# Patient Record
Sex: Male | Born: 1990 | Race: White | Hispanic: No | Marital: Single | Smoking: Never smoker
Health system: Southern US, Community
[De-identification: ages and names within clinical notes are randomized; demographics above are authoritative.]

## PROBLEM LIST (undated history)

## (undated) HISTORY — PX: ABDOMINAL SURGERY: SHX537

---
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
## 2011-06-17 ENCOUNTER — Emergency Department: Payer: Self-pay | Admitting: *Deleted

## 2012-09-22 ENCOUNTER — Emergency Department: Payer: Self-pay | Admitting: Emergency Medicine

## 2012-09-23 LAB — URINALYSIS, COMPLETE
Blood: NEGATIVE
Glucose,UR: NEGATIVE mg/dL (ref 0–75)
Leukocyte Esterase: NEGATIVE
Nitrite: NEGATIVE
Ph: 8 (ref 4.5–8.0)
Specific Gravity: 1.02 (ref 1.003–1.030)
Squamous Epithelial: <1

## 2012-09-23 LAB — COMPREHENSIVE METABOLIC PANEL
Alkaline Phosphatase: 96 U/L (ref 50–136)
Anion Gap: 8 (ref 7–16)
Co2: 24 mmol/L (ref 21–32)
Creatinine: 1.21 mg/dL (ref 0.60–1.30)
EGFR (Non-African Amer.): >60
Glucose: 110 mg/dL — ABNORMAL HIGH (ref 65–99)
Potassium: 3.5 mmol/L (ref 3.5–5.1)
Sodium: 136 mmol/L (ref 136–145)

## 2012-09-23 LAB — CBC
HCT: 44.4 % (ref 40.0–52.0)
MCHC: 33.5 g/dL (ref 32.0–36.0)
MCV: 90 fL (ref 80–100)
Platelet: 197 10*3/uL (ref 150–440)
RBC: 4.92 10*6/uL (ref 4.40–5.90)
RDW: 12.7 % (ref 11.5–14.5)
WBC: 18.7 10*3/uL — ABNORMAL HIGH (ref 3.8–10.6)

## 2013-07-06 ENCOUNTER — Emergency Department: Payer: Self-pay | Admitting: Emergency Medicine

## 2013-07-06 LAB — COMPREHENSIVE METABOLIC PANEL
Albumin: 4.1 g/dL (ref 3.4–5.0)
Alkaline Phosphatase: 98 U/L (ref 50–136)
Anion Gap: 6 — ABNORMAL LOW (ref 7–16)
Bilirubin,Total: 0.7 mg/dL (ref 0.2–1.0)
Creatinine: 1.13 mg/dL (ref 0.60–1.30)
EGFR (African American): >60
Osmolality: 274 (ref 275–301)
Potassium: 3.2 mmol/L — ABNORMAL LOW (ref 3.5–5.1)
SGPT (ALT): 17 U/L (ref 12–78)
Total Protein: 7.3 g/dL (ref 6.4–8.2)

## 2013-07-06 LAB — URINALYSIS, COMPLETE
Bacteria: NONE SEEN
Blood: NEGATIVE
Glucose,UR: NEGATIVE mg/dL (ref 0–75)
Ketone: NEGATIVE
Leukocyte Esterase: NEGATIVE
Nitrite: NEGATIVE
Ph: 7 (ref 4.5–8.0)
Protein: NEGATIVE
RBC,UR: <1 /HPF (ref 0–5)
Specific Gravity: 1.014 (ref 1.003–1.030)
Squamous Epithelial: <1

## 2013-07-06 LAB — ETHANOL
Ethanol %: 0.004 % (ref 0.000–0.080)
Ethanol: 4 mg/dL

## 2013-07-06 LAB — DRUG SCREEN, URINE
Benzodiazepine, Ur Scrn: NEGATIVE (ref ?–200)
MDMA (Ecstasy)Ur Screen: NEGATIVE (ref ?–500)
Methadone, Ur Screen: NEGATIVE (ref ?–300)
Phencyclidine (PCP) Ur S: NEGATIVE (ref ?–25)
Tricyclic, Ur Screen: NEGATIVE (ref ?–1000)

## 2013-07-06 LAB — CBC
HCT: 42 % (ref 40.0–52.0)
Platelet: 198 10*3/uL (ref 150–440)
RBC: 4.76 10*6/uL (ref 4.40–5.90)
RDW: 13 % (ref 11.5–14.5)

## 2014-05-23 ENCOUNTER — Emergency Department: Payer: Self-pay | Admitting: Emergency Medicine

## 2014-09-14 ENCOUNTER — Emergency Department: Payer: Self-pay | Admitting: Emergency Medicine

## 2014-09-14 LAB — CBC WITH DIFFERENTIAL/PLATELET
Basophil #: 0.1 10*3/uL (ref 0.0–0.1)
Basophil %: 0.5 %
Eosinophil #: 0.1 10*3/uL (ref 0.0–0.7)
Eosinophil %: 0.7 %
HCT: 43.3 % (ref 40.0–52.0)
HGB: 14.4 g/dL (ref 13.0–18.0)
LYMPHS PCT: 12.5 %
Lymphocyte #: 1.6 10*3/uL (ref 1.0–3.6)
MCH: 30 pg (ref 26.0–34.0)
MCHC: 33.2 g/dL (ref 32.0–36.0)
MCV: 91 fL (ref 80–100)
Monocyte #: 0.7 x10 3/mm (ref 0.2–1.0)
Monocyte %: 5.4 %
NEUTROS ABS: 10.4 10*3/uL — AB (ref 1.4–6.5)
NEUTROS PCT: 80.9 %
Platelet: 165 10*3/uL (ref 150–440)
RBC: 4.78 10*6/uL (ref 4.40–5.90)
RDW: 12.8 % (ref 11.5–14.5)
WBC: 12.9 10*3/uL — ABNORMAL HIGH (ref 3.8–10.6)

## 2014-09-14 LAB — COMPREHENSIVE METABOLIC PANEL
ALT: 20 U/L
Albumin: 4.1 g/dL (ref 3.4–5.0)
Alkaline Phosphatase: 76 U/L
Anion Gap: 6 — ABNORMAL LOW (ref 7–16)
BUN: 8 mg/dL (ref 7–18)
Bilirubin,Total: 0.4 mg/dL (ref 0.2–1.0)
CALCIUM: 8.8 mg/dL (ref 8.5–10.1)
CHLORIDE: 104 mmol/L (ref 98–107)
CO2: 27 mmol/L (ref 21–32)
Creatinine: 1.02 mg/dL (ref 0.60–1.30)
Glucose: 127 mg/dL — ABNORMAL HIGH (ref 65–99)
Osmolality: 274 (ref 275–301)
Potassium: 3.6 mmol/L (ref 3.5–5.1)
SGOT(AST): 14 U/L — ABNORMAL LOW (ref 15–37)
Sodium: 137 mmol/L (ref 136–145)
Total Protein: 7.4 g/dL (ref 6.4–8.2)

## 2014-09-14 LAB — MONONUCLEOSIS SCREEN: Mono Test: POSITIVE

## 2014-09-14 LAB — LIPASE, BLOOD: Lipase: 91 U/L (ref 73–393)

## 2014-12-25 ENCOUNTER — Emergency Department
Admit: 2014-12-25 | Disposition: A | Discharge status: Home / Self Care | Payer: Self-pay | Admitting: Emergency Medicine

## 2014-12-25 LAB — COMPREHENSIVE METABOLIC PANEL
ALBUMIN: 4.7 g/dL
ALK PHOS: 64 U/L
ANION GAP: 4 — AB (ref 7–16)
BILIRUBIN TOTAL: 0.6 mg/dL
BUN: 11 mg/dL
CHLORIDE: 106 mmol/L
CREATININE: 0.93 mg/dL
Calcium, Total: 9.8 mg/dL
Co2: 29 mmol/L
EGFR (African American): >60
EGFR (Non-African Amer.): >60
GLUCOSE: 82 mg/dL
Potassium: 3.9 mmol/L
SGOT(AST): 19 U/L
SGPT (ALT): 15 U/L — ABNORMAL LOW
Sodium: 139 mmol/L
Total Protein: 7.7 g/dL

## 2014-12-25 LAB — CBC WITH DIFFERENTIAL/PLATELET
BASOS ABS: 0 10*3/uL (ref 0.0–0.1)
Basophil %: 0.4 %
Eosinophil #: 0.3 10*3/uL (ref 0.0–0.7)
Eosinophil %: 3.2 %
HCT: 43.9 % (ref 40.0–52.0)
HGB: 14.6 g/dL (ref 13.0–18.0)
Lymphocyte #: 3.1 10*3/uL (ref 1.0–3.6)
Lymphocyte %: 33.6 %
MCH: 29.4 pg (ref 26.0–34.0)
MCHC: 33.3 g/dL (ref 32.0–36.0)
MCV: 88 fL (ref 80–100)
MONO ABS: 0.7 x10 3/mm (ref 0.2–1.0)
Monocyte %: 7.4 %
NEUTROS ABS: 5.1 10*3/uL (ref 1.4–6.5)
Neutrophil %: 55.4 %
PLATELETS: 188 10*3/uL (ref 150–440)
RBC: 4.98 10*6/uL (ref 4.40–5.90)
RDW: 13 % (ref 11.5–14.5)
WBC: 9.1 10*3/uL (ref 3.8–10.6)

## 2014-12-25 LAB — URINALYSIS, COMPLETE
BACTERIA: NONE SEEN
BLOOD: NEGATIVE
Bilirubin,UR: NEGATIVE
GLUCOSE, UR: NEGATIVE mg/dL (ref 0–75)
Ketone: NEGATIVE
Leukocyte Esterase: NEGATIVE
Nitrite: NEGATIVE
Ph: 6 (ref 4.5–8.0)
Protein: NEGATIVE
RBC,UR: NONE SEEN /HPF (ref 0–5)
SPECIFIC GRAVITY: 1.003 (ref 1.003–1.030)
SQUAMOUS EPITHELIAL: NONE SEEN

## 2014-12-25 LAB — LIPASE, BLOOD: LIPASE: 26 U/L

## 2015-12-08 ENCOUNTER — Encounter: Payer: Self-pay | Admitting: *Deleted

## 2015-12-08 ENCOUNTER — Emergency Department
Admission: EM | Admit: 2015-12-08 | Discharge: 2015-12-08 | Disposition: A | Discharge status: Home / Self Care | Payer: PRIVATE HEALTH INSURANCE | Attending: Emergency Medicine | Admitting: Emergency Medicine

## 2015-12-08 ENCOUNTER — Emergency Department: Payer: PRIVATE HEALTH INSURANCE

## 2015-12-08 DIAGNOSIS — Y998 Other external cause status: Secondary | ICD-10-CM | POA: Diagnosis not present

## 2015-12-08 DIAGNOSIS — Y92410 Unspecified street and highway as the place of occurrence of the external cause: Secondary | ICD-10-CM | POA: Diagnosis not present

## 2015-12-08 DIAGNOSIS — M62830 Muscle spasm of back: Secondary | ICD-10-CM

## 2015-12-08 DIAGNOSIS — Y9389 Activity, other specified: Secondary | ICD-10-CM | POA: Diagnosis not present

## 2015-12-08 DIAGNOSIS — S3992XA Unspecified injury of lower back, initial encounter: Secondary | ICD-10-CM | POA: Insufficient documentation

## 2015-12-08 DIAGNOSIS — S8001XA Contusion of right knee, initial encounter: Secondary | ICD-10-CM | POA: Diagnosis not present

## 2015-12-08 DIAGNOSIS — S8991XA Unspecified injury of right lower leg, initial encounter: Secondary | ICD-10-CM | POA: Diagnosis present

## 2015-12-08 MED ORDER — METHOCARBAMOL 500 MG PO TABS: 500.0000 mg | ORAL_TABLET | Freq: Four times a day (QID) | ORAL | Status: DC

## 2015-12-08 MED ORDER — NAPROXEN 500 MG PO TABS: 500.0000 mg | ORAL_TABLET | Freq: Two times a day (BID) | ORAL | Status: DC
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
## 2015-12-08 NOTE — Discharge Instructions (Signed)

## 2017-03-27 IMAGING — CR DG ABDOMEN 2V
1 series · 2 of 2 positions shown · non-contrast
Comparison: None.

CLINICAL DATA: Right upper quadrant pain for past month.  Nausea.

EXAM:
ABDOMEN - 2 VIEW

[Series 1: dxr abdomen 2 v flat and erect · 0.14mm/px · 2 of 2 slices shown]
[im 1/2]
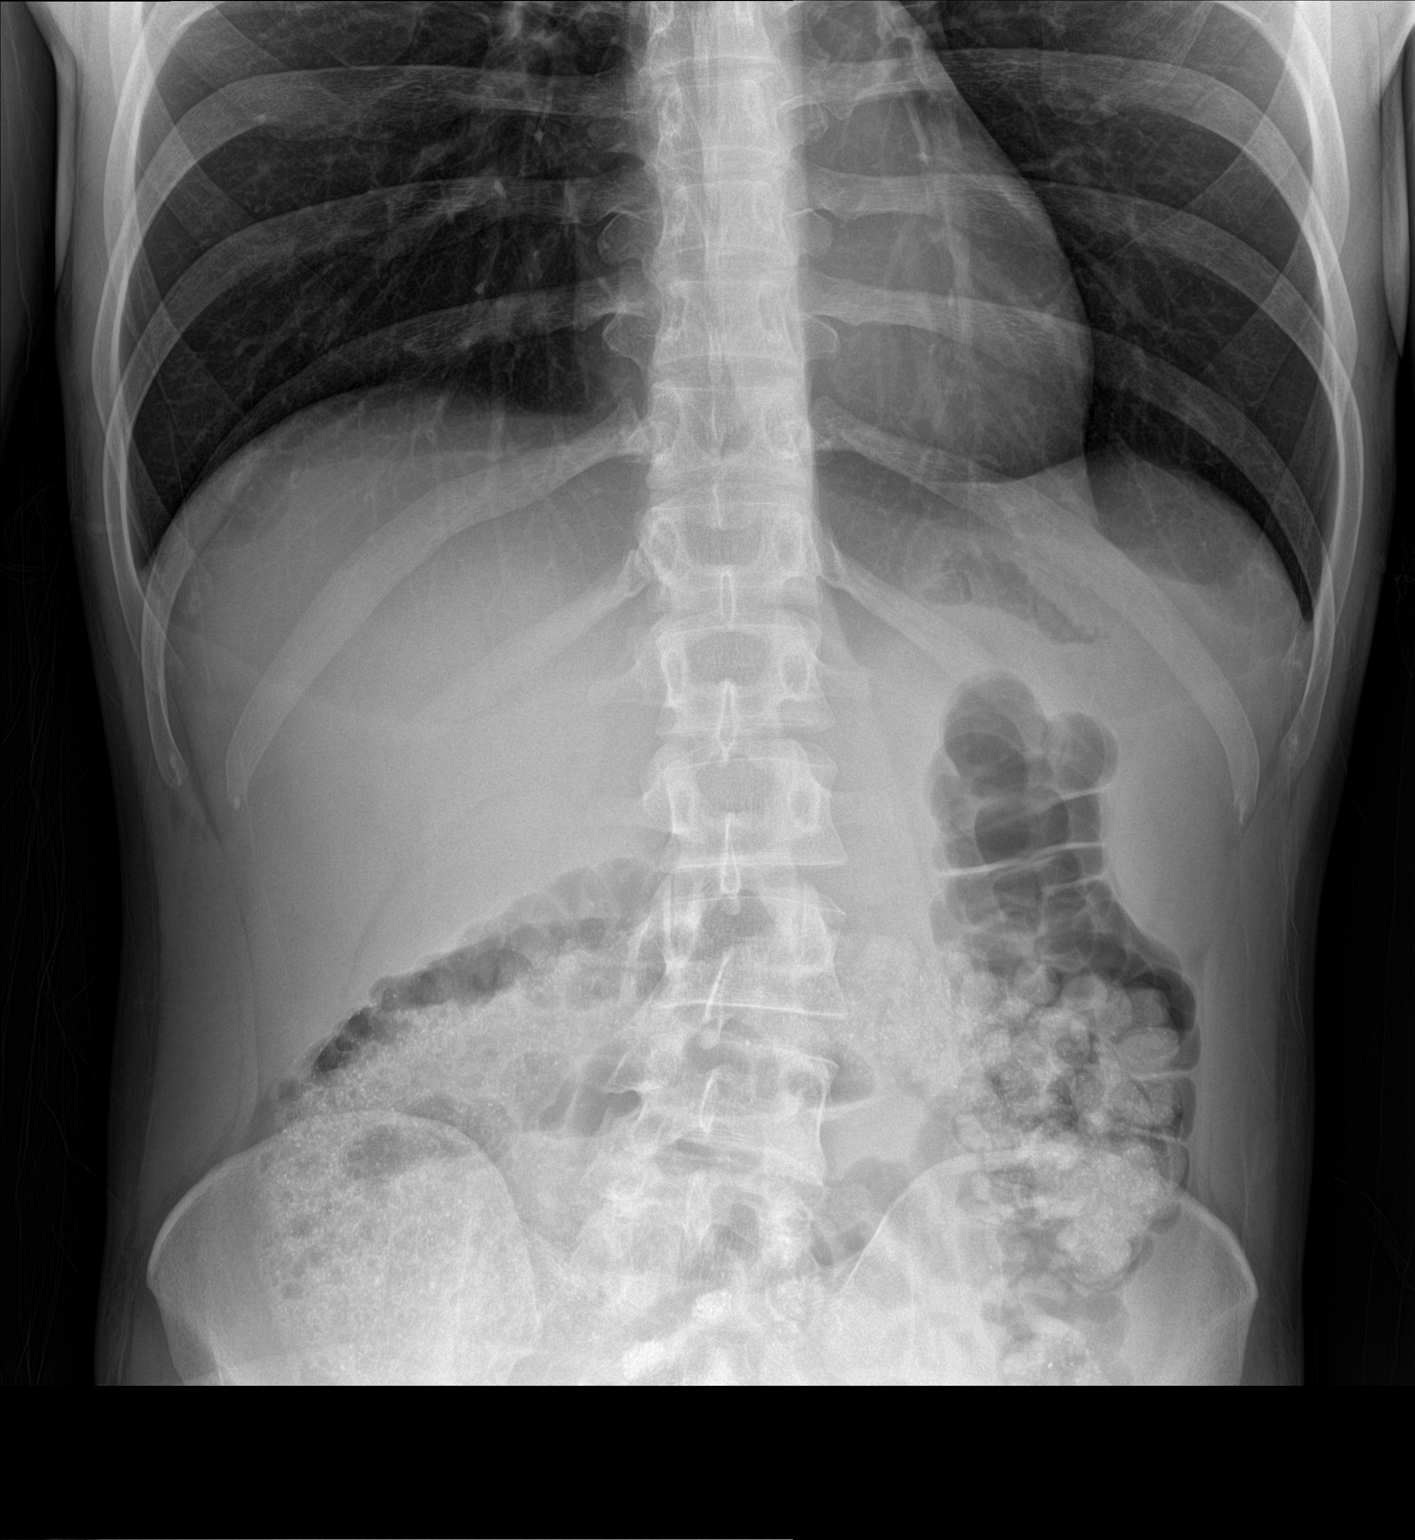
[im 2/2]
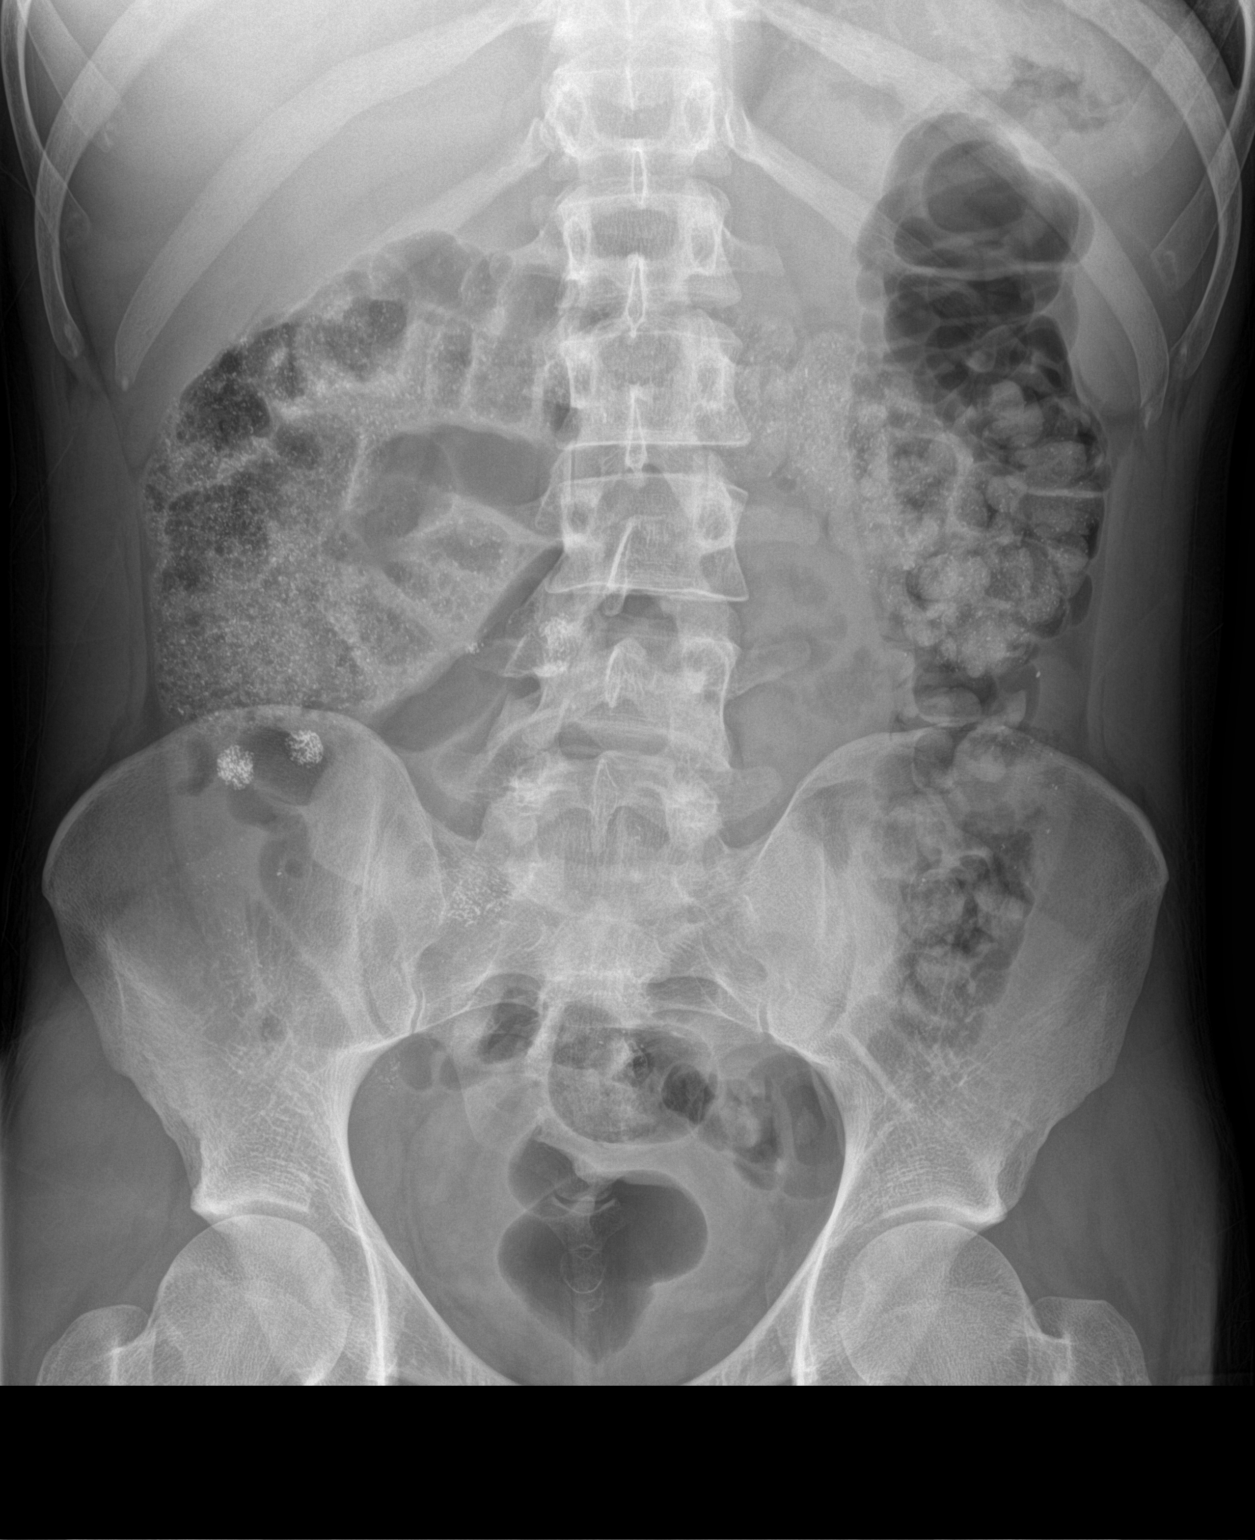

[2 of 2 positions shown; findings below may reference images not displayed]

FINDINGS: No evidence of dilated bowel loops or air-fluid levels. Ingested
radiopaque substance is seen throughout the majority the colon.
There is no evidence of free air. No definite radio-opaque calculi
or other significant radiographic abnormality is seen.
IMPRESSION: No evidence of bowel obstruction or other acute findings. Ingested
radiopaque substance noted throughout the colon.

## 2018-03-04 ENCOUNTER — Encounter: Payer: Self-pay | Admitting: Emergency Medicine

## 2018-03-04 ENCOUNTER — Emergency Department
Admission: EM | Admit: 2018-03-04 | Discharge: 2018-03-04 | Disposition: A | Discharge status: Home / Self Care | Payer: PRIVATE HEALTH INSURANCE | Attending: Emergency Medicine | Admitting: Emergency Medicine

## 2018-03-04 ENCOUNTER — Other Ambulatory Visit: Payer: Self-pay

## 2018-03-04 DIAGNOSIS — I499 Cardiac arrhythmia, unspecified: Secondary | ICD-10-CM | POA: Insufficient documentation

## 2018-03-04 DIAGNOSIS — R42 Dizziness and giddiness: Secondary | ICD-10-CM

## 2018-03-04 DIAGNOSIS — I440 Atrioventricular block, first degree: Secondary | ICD-10-CM | POA: Insufficient documentation

## 2018-03-04 DIAGNOSIS — R112 Nausea with vomiting, unspecified: Secondary | ICD-10-CM | POA: Insufficient documentation

## 2018-03-04 DIAGNOSIS — R001 Bradycardia, unspecified: Secondary | ICD-10-CM

## 2018-03-04 DIAGNOSIS — I498 Other specified cardiac arrhythmias: Secondary | ICD-10-CM

## 2018-03-04 LAB — URINALYSIS, COMPLETE (UACMP) WITH MICROSCOPIC
BACTERIA UA: NONE SEEN
BILIRUBIN URINE: NEGATIVE
Glucose, UA: NEGATIVE mg/dL
Hgb urine dipstick: NEGATIVE
Ketones, ur: 20 mg/dL — AB
Leukocytes, UA: NEGATIVE
NITRITE: NEGATIVE
Protein, ur: NEGATIVE mg/dL
SPECIFIC GRAVITY, URINE: 1.019 (ref 1.005–1.030)
pH: 6 (ref 5.0–8.0)

## 2018-03-04 LAB — URINE DRUG SCREEN, QUALITATIVE (ARMC ONLY)
AMPHETAMINES, UR SCREEN: NOT DETECTED
Benzodiazepine, Ur Scrn: NOT DETECTED
CANNABINOID 50 NG, UR ~~LOC~~: NOT DETECTED
Cocaine Metabolite,Ur ~~LOC~~: NOT DETECTED
MDMA (Ecstasy)Ur Screen: NOT DETECTED
Methadone Scn, Ur: NOT DETECTED
Opiate, Ur Screen: NOT DETECTED
PHENCYCLIDINE (PCP) UR S: NOT DETECTED
Tricyclic, Ur Screen: NOT DETECTED

## 2018-03-04 LAB — COMPREHENSIVE METABOLIC PANEL
ALBUMIN: 5 g/dL (ref 3.5–5.0)
ALT: 21 U/L (ref 17–63)
ANION GAP: 13 (ref 5–15)
AST: 44 U/L — ABNORMAL HIGH (ref 15–41)
Alkaline Phosphatase: 60 U/L (ref 38–126)
BUN: 17 mg/dL (ref 6–20)
CHLORIDE: 103 mmol/L (ref 101–111)
CO2: 21 mmol/L — AB (ref 22–32)
Calcium: 9.7 mg/dL (ref 8.9–10.3)
Creatinine, Ser: 0.79 mg/dL (ref 0.61–1.24)
GFR calc Af Amer: >60 mL/min (ref >60)
GFR calc non Af Amer: >60 mL/min (ref >60)
GLUCOSE: 100 mg/dL — AB (ref 65–99)
POTASSIUM: 3.8 mmol/L (ref 3.5–5.1)
SODIUM: 137 mmol/L (ref 135–145)
Total Bilirubin: 1.4 mg/dL — ABNORMAL HIGH (ref 0.3–1.2)
Total Protein: 8 g/dL (ref 6.5–8.1)

## 2018-03-04 LAB — CBC
HEMATOCRIT: 42.9 % (ref 40.0–52.0)
HEMOGLOBIN: 14.6 g/dL (ref 13.0–18.0)
MCH: 30.6 pg (ref 26.0–34.0)
MCHC: 34.1 g/dL (ref 32.0–36.0)
MCV: 89.6 fL (ref 80.0–100.0)
Platelets: 208 10*3/uL (ref 150–440)
RBC: 4.79 MIL/uL (ref 4.40–5.90)
RDW: 13.1 % (ref 11.5–14.5)
WBC: 13.9 10*3/uL — ABNORMAL HIGH (ref 3.8–10.6)

## 2018-03-04 LAB — LIPASE, BLOOD: Lipase: 24 U/L (ref 11–51)

## 2018-03-04 LAB — TSH: TSH: 0.328 u[IU]/mL — AB (ref 0.350–4.500)

## 2018-03-04 LAB — MAGNESIUM: MAGNESIUM: 2.3 mg/dL (ref 1.7–2.4)

## 2018-03-04 LAB — ETHANOL: Alcohol, Ethyl (B): <10 mg/dL (ref <10)

## 2018-03-04 MED ORDER — ONDANSETRON 4 MG PO TBDP: 4.0000 mg | ORAL_TABLET | Freq: Three times a day (TID) | ORAL | 0 refills | Status: DC | PRN

## 2018-03-04 MED ORDER — ONDANSETRON HCL 4 MG/2ML IJ SOLN
4.0000 mg | Freq: Once | INTRAMUSCULAR | Status: AC
  Administered 2018-03-04: 4 mg via INTRAVENOUS

## 2018-03-04 MED ORDER — ONDANSETRON 4 MG PO TBDP
4.0000 mg | ORAL_TABLET | Freq: Once | ORAL | Status: AC
  Administered 2018-03-04: 4 mg via ORAL
  Filled 2018-03-04: qty 1

## 2018-03-04 MED ORDER — ONDANSETRON HCL 4 MG/2ML IJ SOLN
INTRAMUSCULAR | Status: AC
  Administered 2018-03-04: 4 mg via INTRAVENOUS
  Filled 2018-03-04: qty 2

## 2018-03-04 NOTE — ED Notes (Signed)
Provided pt with a cup of ginger ale

## 2018-03-04 NOTE — ED Notes (Signed)
Pt ambulated to the bathroom and returned to his room without difficulty.  

## 2018-03-04 NOTE — ED Triage Notes (Signed)
Nausea and vomiting intermittent x 2 weeks. Patient c/o dizziness and hand cramps. Noted hyperventilating. Able to slow and breath less deep with instructions. Denies abd pain.

## 2018-03-04 NOTE — Discharge Instructions (Signed)
Please drink plenty of fluids to stay well-hydrated.  Eat small regular healthy meals throughout the day.  Please make an appointment with a primary care physician to follow-up on her symptoms from today.  Today, your heart rate was found to be slightly irregular and slow.  While this may be normal for you, it may also be causing her lightheadedness.  Please make a follow-up appointment with the cardiologist, Dr. Welton FlakesKhan, for further evaluation.  To the emergency department if you develop severe pain, palpitations or chest pain, lightheadedness or fainting, inability to keep down fluids, fever, or any other symptoms concerning to you.

## 2018-03-04 NOTE — ED Provider Notes (Signed)
[facility] Emergency Department Provider Note  ____________________________________________  Time seen: Approximately 7:30 PM  I have reviewed the triage vital signs and the nursing notes.   HISTORY  Chief Complaint Nausea and Emesis    HPI Alan Mercado is a 27 y.o. male presenting with, lightheadedness.  For the last several months, the patient has had intermittent episodes where he has nausea and vomiting.  He occasionally has associated lightheadedness, or bilateral hand cramps.  Last night, the patient reports he was at a concert last night and had several beers.  Today he woke up with multiple episodes of nausea and vomiting and lightheadedness.  He reports weight loss although his girlfriend states he has not lost any weight.  He denies any fevers or chills, dysuria, constipation or diarrhea.  No abdominal pain.  He does report that he had similar episode several years ago and was placed on a medication for reflux disease which completely cured his symptoms.  He is no longer on that medication.  SH: Positive EtOH.  Denies marijuana or cocaine peer  History reviewed. No pertinent past medical history.  There are no active problems to display for this patient.   History reviewed. No pertinent surgical history.  Current Outpatient Rx  . Order #: [id number] Class: Print  . Order #: [id number] Class: Print  . Order #: [id number] Class: Print    Allergies Patient has no known allergies.  No family history on file.  Social History Social History   Tobacco Use  . Smoking status: Never Smoker  Substance Use Topics  . Alcohol use: No  . Drug use: Not on file    Review of Systems Constitutional: No fever/chills.  Positive lightheadedness without syncope. Eyes: No visual changes. ENT: No sore throat. No congestion or rhinorrhea. Cardiovascular: Denies chest pain. Denies palpitations. Respiratory: Denies shortness of breath.  No  cough. Gastrointestinal: No abdominal pain.  Positive nausea, positive vomiting.  No diarrhea.  No constipation. Genitourinary: Negative for dysuria. Musculoskeletal: Negative for back pain. Skin: Negative for rash. Neurological: Negative for headaches. No focal numbness, tingling or weakness.  Positive bilateral hand cramps, intermittent.    ____________________________________________   PHYSICAL EXAM:  VITAL SIGNS: ED Triage Vitals [03/04/18 1519]  Enc Vitals Group     BP 139/89     Pulse Rate (!) 102     Resp 20     Temp 98.4 F (36.9 C)     Temp Source Oral     SpO2 100 %     Weight 130 lb (59 kg)     Height 5\' 8"  (1.727 m)     Head Circumference      Peak Flow      Pain Score 0     Pain Loc      Pain Edu?      Excl. in GC?     Constitutional: Alert and oriented.Answers questions appropriately. Eyes: Conjunctivae are normal.  EOMI. No scleral icterus. Head: Atraumatic. Nose: No congestion/rhinnorhea. Mouth/Throat: Mucous membranes are moist.  Neck: No stridor.  Supple.  No JVD.  No meningismus. Cardiovascular: Normal rate, regular rhythm. No murmurs, rubs or gallops.  Respiratory: Normal respiratory effort.  No accessory muscle use or retractions. Lungs CTAB.  No wheezes, rales or ronchi. Gastrointestinal: Soft, nontender and nondistended.  No guarding or rebound.  No peritoneal signs. Musculoskeletal: No LE edema. Neurologic:  A&Ox3.  Speech is clear.  Face and smile are symmetric.  EOMI.  Moves all extremities well. Skin:  Skin is warm, dry and intact. No rash noted. Psychiatric: Depressed mood and affect but normal speech, good insight.  Normal judgment.  ____________________________________________   LABS (all labs ordered are listed, but only abnormal results are displayed)  Labs Reviewed  COMPREHENSIVE METABOLIC PANEL - Abnormal; Notable for the following components:      Result Value   CO2 21 (*)    Glucose, Bld 100 (*)    AST 44 (*)    Total  Bilirubin 1.4 (*)    All other components within normal limits  CBC - Abnormal; Notable for the following components:   WBC 13.9 (*)    All other components within normal limits  URINALYSIS, COMPLETE (UACMP) WITH MICROSCOPIC - Abnormal; Notable for the following components:   Color, Urine YELLOW (*)    APPearance CLEAR (*)    Ketones, ur 20 (*)    All other components within normal limits  TSH - Abnormal; Notable for the following components:   TSH 0.328 (*)    All other components within normal limits  LIPASE, BLOOD  MAGNESIUM  ETHANOL  URINE DRUG SCREEN, QUALITATIVE ([facility] ONLY)   ____________________________________________  EKG  ED ECG REPORT I, Rockne Menghini, the attending physician, personally viewed and interpreted this ECG.   Date: 03/04/2018  EKG Time: 1933  Rate: 58  Rhythm: sinus bradycardia w/ sinus arrhythmia  Axis: normal  Intervals:first-degree A-V block   ST&T Change: No STEMI  Repeat EKG: ED ECG REPORT I, Rockne Menghini, the attending physician, personally viewed and interpreted this ECG.   Date: 03/04/2018  EKG Time: 1938  Rate: 61  Rhythm: normal sinus rhythm  Axis: normal  Intervals:first-degree A-V block   ST&T Change: No STEMI   Repeat EKG: ED ECG REPORT I, Rockne Menghini, the attending physician, personally viewed and interpreted this ECG.   Date: 03/04/2018  EKG Time: 2224  Rate: 50  Rhythm: sinus bradycardia  Axis: normal  Intervals:first-degree A-V block   ST&T Change: No STEMI; no evidence of Brugada syndrome, prolonged QTC or hypertrophy.   ____________________________________________  RADIOLOGY  No results found.  ____________________________________________   PROCEDURES  Procedure(s) performed: None  Procedures  Critical Care performed: No ____________________________________________   INITIAL IMPRESSION / ASSESSMENT AND PLAN / ED COURSE  Pertinent labs & imaging results that were  available during my care of the patient were reviewed by me and considered in my medical decision making (see chart for details).  27 y.o. male, otherwise healthy, presenting with nausea and vomiting, lightheadedness for several months, with a repeat episode this morning after drinking several beers last night.  Overall, the patient is hemodynamically stable.  Will get orthostatic vital signs to see if his lightheadedness may be due to hypovolemia from his vomiting.  An EKG is pending.  The patient's laboratory studies are reassuring.  His sodium and potassium are normal, he has no evidence of hypoglycemia.  His lipase is normal.  He is not anemic.  He has an elevated white blood cell count, which is nonspecific, and he has had elevation in the past.  His urinalysis does not show infection.  ----------------------------------------- 10:24 PM on 03/04/2018 -----------------------------------------  510 multiple EKGs on the patient since he has been here, which do show a sinus arrhythmia.  His first EKG has some bradycardia and the sinus arrhythmia appears to be related to respiration.  This may be impacting his lightheaded sensation.  He does have a first-degree AV block.  There is no evidence for ischemia, nor evidence  of Brugada syndrome, prolonged QTC or hypertrophy.  I do not see any evidence that this rhythm is unstable and it is repeatable.  However, I will have him follow-up with cardiologist on call in the next 1 to 2 days.  Patient's electrolytes are reassuring, and he is now able to tolerate liquids without any difficulty.  At this time, the patient is safe for discharged  home.  Follow-up instructions as well as return precautions have been discussed.  ____________________________________________  FINAL CLINICAL IMPRESSION(S) / ED DIAGNOSES  Final diagnoses:  Sinus arrhythmia  Lightheadedness  Sinus bradycardia  Non-intractable vomiting with nausea, unspecified vomiting type  First  degree AV block         NEW MEDICATIONS STARTED DURING THIS VISIT:  New Prescriptions   ONDANSETRON (ZOFRAN ODT) 4 MG DISINTEGRATING TABLET    Take 1 tablet (4 mg total) by mouth every 8 (eight) hours as needed for nausea or vomiting.      Rockne Menghini, MD 03/04/18 2227

## 2018-04-14 ENCOUNTER — Emergency Department
Admission: EM | Admit: 2018-04-14 | Discharge: 2018-04-14 | Disposition: A | Discharge status: Home / Self Care | Payer: Self-pay | Attending: Emergency Medicine | Admitting: Emergency Medicine

## 2018-04-14 ENCOUNTER — Other Ambulatory Visit: Payer: Self-pay

## 2018-04-14 ENCOUNTER — Encounter: Payer: Self-pay | Admitting: Emergency Medicine

## 2018-04-14 DIAGNOSIS — W268XXA Contact with other sharp object(s), not elsewhere classified, initial encounter: Secondary | ICD-10-CM | POA: Insufficient documentation

## 2018-04-14 DIAGNOSIS — Y998 Other external cause status: Secondary | ICD-10-CM | POA: Insufficient documentation

## 2018-04-14 DIAGNOSIS — Y929 Unspecified place or not applicable: Secondary | ICD-10-CM | POA: Insufficient documentation

## 2018-04-14 DIAGNOSIS — Z23 Encounter for immunization: Secondary | ICD-10-CM | POA: Insufficient documentation

## 2018-04-14 DIAGNOSIS — S61011A Laceration without foreign body of right thumb without damage to nail, initial encounter: Secondary | ICD-10-CM | POA: Insufficient documentation

## 2018-04-14 DIAGNOSIS — Y9389 Activity, other specified: Secondary | ICD-10-CM | POA: Insufficient documentation

## 2018-04-14 MED ORDER — BACITRACIN ZINC 500 UNIT/GM EX OINT
TOPICAL_OINTMENT | CUTANEOUS | Status: AC
  Filled 2018-04-14: qty 0.9

## 2018-04-14 MED ORDER — CEPHALEXIN 500 MG PO CAPS: 500.0000 mg | ORAL_CAPSULE | Freq: Two times a day (BID) | ORAL | 0 refills | Status: AC

## 2018-04-14 MED ORDER — TETANUS-DIPHTH-ACELL PERTUSSIS 5-2.5-18.5 LF-MCG/0.5 IM SUSP
0.5000 mL | Freq: Once | INTRAMUSCULAR | Status: AC
  Administered 2018-04-14: 0.5 mL via INTRAMUSCULAR
  Filled 2018-04-14: qty 0.5

## 2018-04-14 MED ORDER — LIDOCAINE HCL (PF) 1 % IJ SOLN
5.0000 mL | Freq: Once | INTRAMUSCULAR | Status: AC
  Administered 2018-04-14: 5 mL via INTRADERMAL
  Filled 2018-04-14: qty 5

## 2018-04-14 NOTE — ED Triage Notes (Signed)
Pt to ED via POV, pt states that he has a laceration to the right thumb. Happened about 20 minutes PTA. Pt is in NAD at this time.

## 2018-04-14 NOTE — ED Notes (Signed)
E-sig pad in room not working. Pt signed printed D/C form and form was placed in HIM box to be scanned into patient  Chart.

## 2018-04-14 NOTE — ED Notes (Signed)
Pt states he cut his thumb on the right using a spindle to fix his car. Pt states he is not sure when last tetanus shot was.

## 2018-04-14 NOTE — Discharge Instructions (Signed)
Do not get the sutured area wet for 24 hours. After 24 hours, shower/bathe as usual and pat the area dry. °Change the bandage 2 times per day and apply antibiotic ointment. °Leave open to air when at no risk of getting the area dirty, but cover at night before bed. °See your PCP or go to Urgent Care in 10 days for suture removal or sooner for signs or concern of infection. ° °

## 2018-04-14 NOTE — ED Provider Notes (Signed)
[facility] Emergency Department Provider Note  ____________________________________________  Time seen: Approximately 6:58 PM  I have reviewed the triage vital signs and the nursing notes.   HISTORY  Chief Complaint Laceration   HPI Alan Mercado is a 27 y.o. male who presents to the emergency department for treatment and evaluation of a laceration to his right thumb. He was working on a car and cut his finger on a bearing.  Tetanus vaccination is up-to-date.  Bleeding is well controlled.   History reviewed. No pertinent past medical history.  There are no active problems to display for this patient.   Past Surgical History:  Procedure Laterality Date  . ABDOMINAL SURGERY      Prior to Admission medications   Medication Sig Start Date End Date Taking? Authorizing Provider  cephALEXin (KEFLEX) 500 MG capsule Take 1 capsule (500 mg total) by mouth 2 (two) times daily for 10 days. 04/14/18 04/24/18  Fares Ramthun, Kasandra Knudsen, FNP  methocarbamol (ROBAXIN) 500 MG tablet Take 1 tablet (500 mg total) by mouth 4 (four) times daily. 12/08/15   Cuthriell, Delorise Royals, PA-C  naproxen (NAPROSYN) 500 MG tablet Take 1 tablet (500 mg total) by mouth 2 (two) times daily with a meal. 12/08/15   Cuthriell, Delorise Royals, PA-C  ondansetron (ZOFRAN ODT) 4 MG disintegrating tablet Take 1 tablet (4 mg total) by mouth every 8 (eight) hours as needed for nausea or vomiting. 03/04/18   Rockne Menghini, MD    Allergies Patient has no known allergies.  No family history on file.  Social History Social History   Tobacco Use  . Smoking status: Never Smoker  . Smokeless tobacco: Never Used  Substance Use Topics  . Alcohol use: No  . Drug use: Not Currently    Review of Systems  Constitutional: Negative for fever. Respiratory: Negative for cough or shortness of breath.  Musculoskeletal: Negative for myalgias Skin: Positive for laceration Neurological: Negative for numbness  or paresthesias. ____________________________________________   PHYSICAL EXAM:  VITAL SIGNS: ED Triage Vitals  Enc Vitals Group     BP 04/14/18 1641 116/78     Pulse Rate 04/14/18 1641 90     Resp 04/14/18 1641 16     Temp 04/14/18 1641 97.6 F (36.4 C)     Temp Source 04/14/18 1641 Oral     SpO2 04/14/18 1641 100 %     Weight 04/14/18 1642 130 lb (59 kg)     Height 04/14/18 1642 5\' 8"  (1.727 m)     Head Circumference --      Peak Flow --      Pain Score 04/14/18 1642 4     Pain Loc --      Pain Edu? --      Excl. in GC? --      Constitutional: Well appearing. Eyes: Conjunctivae are clear without discharge or drainage. Nose: No rhinorrhea noted. Mouth/Throat: Airway is patent.  Neck: No stridor. Unrestricted range of motion observed. Cardiovascular: Capillary refill is <3 seconds.  Respiratory: Respirations are even and unlabored.. Musculoskeletal: Unrestricted range of motion observed. Neurologic: Awake, alert, and oriented x 4.  Skin: 2 cm laceration to the palmar aspect of the right thumb.  ____________________________________________   LABS (all labs ordered are listed, but only abnormal results are displayed)  Labs Reviewed - No data to display ____________________________________________  EKG  Not indicated. ____________________________________________  RADIOLOGY  Not indicated ____________________________________________   PROCEDURES  .Marland KitchenLaceration Repair Date/Time: 04/14/2018 7:01 PM Performed by:  Chinita Pesterriplett, Mivaan Corbitt B, FNP Authorized by: Chinita Pesterriplett, Quinteria Chisum B, FNP   Consent:    Consent obtained:  Verbal   Consent given by:  Patient   Risks discussed:  Infection, pain, retained foreign body, poor cosmetic result and poor wound healing Anesthesia (see MAR for exact dosages):    Anesthesia method:  Nerve block   Block anesthetic:  Lidocaine 1% w/o epi   Block technique:  Trans-thecal block   Block injection procedure:  Anatomic landmarks identified    Block outcome:  Anesthesia achieved Laceration details:    Location:  Finger   Finger location:  R thumb   Length (cm):  2 Repair type:    Repair type:  Simple Exploration:    Hemostasis achieved with:  Direct pressure   Wound exploration: entire depth of wound probed and visualized     Contaminated: no   Treatment:    Area cleansed with:  Saline   Amount of cleaning:  Extensive   Irrigation solution:  Sterile saline   Visualized foreign bodies/material removed: no   Skin repair:    Repair method:  Sutures   Suture size:  5-0   Suture material:  Nylon   Suture technique:  Simple interrupted   Number of sutures:  5 Approximation:    Approximation:  Close Post-procedure details:    Dressing:  Sterile dressing   Patient tolerance of procedure:  Tolerated well, no immediate complications   ____________________________________________   INITIAL IMPRESSION / ASSESSMENT AND PLAN / ED COURSE  Alan Mercado is a 27 y.o. male who presents to the emergency department for treatment and evaluation of laceration to the right thumb.  The wound was cleaned and wound edges were reapproximated with sutures.  Patient tolerated the procedure well.  He is to follow-up with urgent care or primary care in 10 to 12 days for suture removal.  Wound care was discussed.  He will also be placed on Keflex empirically.   Medications  bacitracin 500 UNIT/GM ointment (has no administration in time range)  lidocaine (PF) (XYLOCAINE) 1 % injection 5 mL (5 mLs Intradermal Given 04/14/18 1820)  Tdap (BOOSTRIX) injection 0.5 mL (0.5 mLs Intramuscular Given 04/14/18 1821)     Pertinent labs & imaging results that were available during my care of the patient were reviewed by me and considered in my medical decision making (see chart for details).  ____________________________________________   FINAL CLINICAL IMPRESSION(S) / ED DIAGNOSES  Final diagnoses:  Laceration of right thumb without damage to  nail, foreign body presence unspecified, initial encounter    ED Discharge Orders        Ordered    cephALEXin (KEFLEX) 500 MG capsule  2 times daily     04/14/18 1818       Note:  This document was prepared using Dragon voice recognition software and may include unintentional dictation errors.    Chinita Pesterriplett, Keyauna Graefe B, FNP 04/14/18 [id number]    Goodman, Graydon, MD 04/14/18 [phone]

## 2018-04-14 NOTE — ED Notes (Signed)
Pt verbalizes understanding of d/c instructions and follow up. 

## 2018-06-09 ENCOUNTER — Emergency Department
Admission: EM | Admit: 2018-06-09 | Discharge: 2018-06-09 | Disposition: A | Discharge status: Other Acute Inpatient Hospital | Payer: No Typology Code available for payment source | Attending: Emergency Medicine | Admitting: Emergency Medicine

## 2018-06-09 ENCOUNTER — Emergency Department: Payer: No Typology Code available for payment source

## 2018-06-09 ENCOUNTER — Other Ambulatory Visit: Payer: Self-pay

## 2018-06-09 DIAGNOSIS — Z79899 Other long term (current) drug therapy: Secondary | ICD-10-CM | POA: Insufficient documentation

## 2018-06-09 DIAGNOSIS — M79672 Pain in left foot: Secondary | ICD-10-CM

## 2018-06-09 DIAGNOSIS — M79662 Pain in left lower leg: Secondary | ICD-10-CM | POA: Insufficient documentation

## 2018-06-09 DIAGNOSIS — S32009D Unspecified fracture of unspecified lumbar vertebra, subsequent encounter for fracture with routine healing: Secondary | ICD-10-CM | POA: Insufficient documentation

## 2018-06-09 DIAGNOSIS — S3210XD Unspecified fracture of sacrum, subsequent encounter for fracture with routine healing: Secondary | ICD-10-CM | POA: Insufficient documentation

## 2018-06-09 DIAGNOSIS — R202 Paresthesia of skin: Secondary | ICD-10-CM | POA: Insufficient documentation

## 2018-06-09 DIAGNOSIS — S32009A Unspecified fracture of unspecified lumbar vertebra, initial encounter for closed fracture: Secondary | ICD-10-CM

## 2018-06-09 DIAGNOSIS — S3210XA Unspecified fracture of sacrum, initial encounter for closed fracture: Secondary | ICD-10-CM

## 2018-06-09 NOTE — ED Notes (Signed)
Name of Transporting Agency: [facility]
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
## 2018-06-09 NOTE — ED Notes (Signed)
Patient complains of "burning" to left foot x2 weeks. States "I can't take it anymore". Patient ambulatory to room, favoring left foot. No known injury.
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
## 2020-08-22 ENCOUNTER — Emergency Department: Payer: Self-pay

## 2020-08-22 ENCOUNTER — Emergency Department
Admission: EM | Admit: 2020-08-22 | Discharge: 2020-08-22 | Disposition: A | Discharge status: Home / Self Care | Payer: Self-pay | Attending: Emergency Medicine | Admitting: Emergency Medicine

## 2020-08-22 ENCOUNTER — Encounter: Payer: Self-pay | Admitting: Emergency Medicine

## 2020-08-22 ENCOUNTER — Other Ambulatory Visit: Payer: Self-pay

## 2020-08-22 DIAGNOSIS — M545 Low back pain, unspecified: Secondary | ICD-10-CM | POA: Insufficient documentation

## 2020-08-22 MED ORDER — CYCLOBENZAPRINE HCL 10 MG PO TABS
10.0000 mg | ORAL_TABLET | Freq: Once | ORAL | Status: AC
  Administered 2020-08-22: 10 mg via ORAL
  Filled 2020-08-22: qty 1

## 2020-08-22 MED ORDER — TRAMADOL HCL 50 MG PO TABS
50.0000 mg | ORAL_TABLET | Freq: Once | ORAL | Status: AC
  Administered 2020-08-22: 50 mg via ORAL
  Filled 2020-08-22: qty 1

## 2020-08-22 MED ORDER — BACLOFEN 10 MG PO TABS: 10.0000 mg | ORAL_TABLET | Freq: Three times a day (TID) | ORAL | 0 refills | Status: AC

## 2020-08-22 MED ORDER — MELOXICAM 15 MG PO TABS: 15.0000 mg | ORAL_TABLET | Freq: Every day | ORAL | 0 refills | Status: AC

## 2020-08-22 NOTE — Discharge Instructions (Addendum)
Follow-up with your regular doctor if not improving in 2 to 3 days.  Return emergency department worsening.  He can also follow-up with orthopedics, Dr. Rondel Baton phone number is attached.  Apply ice to the lower area of your back.  Be active so that the muscles will not spasm.
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
